# Patient Record
Sex: Male | Born: 1980 | Race: White | Hispanic: No | Marital: Married | Smoking: Never smoker
Health system: Southern US, Community
[De-identification: ages and names within clinical notes are randomized; demographics above are authoritative.]

## PROBLEM LIST (undated history)

## (undated) DIAGNOSIS — Z8739 Personal history of other diseases of the musculoskeletal system and connective tissue: Secondary | ICD-10-CM

## (undated) DIAGNOSIS — J9383 Other pneumothorax: Secondary | ICD-10-CM

## (undated) HISTORY — PX: CHEST TUBE INSERTION: SHX231

## (undated) HISTORY — PX: APPENDECTOMY: SHX54

---

## 2010-09-29 ENCOUNTER — Emergency Department (HOSPITAL_BASED_OUTPATIENT_CLINIC_OR_DEPARTMENT_OTHER)
Admission: EM | Admit: 2010-09-29 | Discharge: 2010-09-29 | Disposition: A | Discharge status: Home / Self Care | Payer: No Typology Code available for payment source | Attending: Emergency Medicine | Admitting: Emergency Medicine

## 2010-09-29 ENCOUNTER — Emergency Department (INDEPENDENT_AMBULATORY_CARE_PROVIDER_SITE_OTHER): Payer: No Typology Code available for payment source

## 2010-09-29 DIAGNOSIS — S81009A Unspecified open wound, unspecified knee, initial encounter: Secondary | ICD-10-CM

## 2010-09-29 DIAGNOSIS — W298XXA Contact with other powered powered hand tools and household machinery, initial encounter: Secondary | ICD-10-CM

## 2010-09-29 DIAGNOSIS — W01119A Fall on same level from slipping, tripping and stumbling with subsequent striking against unspecified sharp object, initial encounter: Secondary | ICD-10-CM | POA: Insufficient documentation

## 2010-09-29 DIAGNOSIS — S91009A Unspecified open wound, unspecified ankle, initial encounter: Secondary | ICD-10-CM | POA: Insufficient documentation

## 2016-11-10 ENCOUNTER — Emergency Department (HOSPITAL_BASED_OUTPATIENT_CLINIC_OR_DEPARTMENT_OTHER)
Admission: EM | Admit: 2016-11-10 | Discharge: 2016-11-10 | Disposition: A | Discharge status: Home / Self Care | Payer: 59 | Attending: Emergency Medicine | Admitting: Emergency Medicine

## 2016-11-10 ENCOUNTER — Emergency Department (HOSPITAL_BASED_OUTPATIENT_CLINIC_OR_DEPARTMENT_OTHER): Payer: 59

## 2016-11-10 ENCOUNTER — Encounter (HOSPITAL_BASED_OUTPATIENT_CLINIC_OR_DEPARTMENT_OTHER): Payer: Self-pay | Admitting: Emergency Medicine

## 2016-11-10 DIAGNOSIS — R071 Chest pain on breathing: Secondary | ICD-10-CM | POA: Diagnosis not present

## 2016-11-10 DIAGNOSIS — S43101A Unspecified dislocation of right acromioclavicular joint, initial encounter: Secondary | ICD-10-CM | POA: Diagnosis not present

## 2016-11-10 DIAGNOSIS — Y929 Unspecified place or not applicable: Secondary | ICD-10-CM | POA: Insufficient documentation

## 2016-11-10 DIAGNOSIS — Y999 Unspecified external cause status: Secondary | ICD-10-CM | POA: Insufficient documentation

## 2016-11-10 DIAGNOSIS — S4991XA Unspecified injury of right shoulder and upper arm, initial encounter: Secondary | ICD-10-CM | POA: Diagnosis present

## 2016-11-10 DIAGNOSIS — S43004A Unspecified dislocation of right shoulder joint, initial encounter: Secondary | ICD-10-CM

## 2016-11-10 DIAGNOSIS — M546 Pain in thoracic spine: Secondary | ICD-10-CM | POA: Diagnosis not present

## 2016-11-10 DIAGNOSIS — Y9355 Activity, bike riding: Secondary | ICD-10-CM | POA: Insufficient documentation

## 2016-11-10 MED ORDER — OXYCODONE-ACETAMINOPHEN 5-325 MG PO TABS
2.0000 | ORAL_TABLET | Freq: Once | ORAL | Status: AC
  Administered 2016-11-10: 2 via ORAL
  Filled 2016-11-10: qty 2

## 2016-11-10 MED ORDER — OXYCODONE-ACETAMINOPHEN 5-325 MG PO TABS: 1.0000 | ORAL_TABLET | ORAL | 0 refills | Status: DC | PRN

## 2016-11-10 MED ORDER — ONDANSETRON 8 MG PO TBDP
8.0000 mg | ORAL_TABLET | Freq: Once | ORAL | Status: AC
  Administered 2016-11-10: 8 mg via ORAL
  Filled 2016-11-10: qty 1

## 2016-11-10 NOTE — ED Notes (Signed)
PT returned from xray
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
## 2016-11-10 NOTE — Discharge Instructions (Signed)
Take the prescribed medication as directed.  Do not drive while taking this.  Wear shoulder sling for support. Follow-up with orthopedics-- call tomorrow to make appt. Return to the ED for new or worsening symptoms.
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
## 2016-11-10 NOTE — ED Notes (Signed)
Alert, NAD, calm, interactive, resps e/u, speaking in clear complete sentences, no dyspnea noted, skin W&D, VSS, pain improved, pinpoints to R shoulder, trapezius, and posterior R scapula /ribs, (denies: sob, nausea, dizziness, weakness, numbness or tingling, or visual changes). Family at [facility].

## 2018-04-17 ENCOUNTER — Inpatient Hospital Stay (HOSPITAL_BASED_OUTPATIENT_CLINIC_OR_DEPARTMENT_OTHER)
Admission: EM | Admit: 2018-04-17 | Discharge: 2018-04-20 | DRG: 201 | Disposition: A | Discharge status: Home / Self Care | Payer: Self-pay | Source: Ambulatory Visit | Attending: Surgery | Admitting: Surgery

## 2018-04-17 ENCOUNTER — Encounter (HOSPITAL_BASED_OUTPATIENT_CLINIC_OR_DEPARTMENT_OTHER): Payer: Self-pay | Admitting: *Deleted

## 2018-04-17 ENCOUNTER — Observation Stay (HOSPITAL_BASED_OUTPATIENT_CLINIC_OR_DEPARTMENT_OTHER): Payer: Self-pay

## 2018-04-17 ENCOUNTER — Other Ambulatory Visit: Payer: Self-pay

## 2018-04-17 ENCOUNTER — Emergency Department (HOSPITAL_BASED_OUTPATIENT_CLINIC_OR_DEPARTMENT_OTHER): Payer: Self-pay

## 2018-04-17 DIAGNOSIS — J939 Pneumothorax, unspecified: Secondary | ICD-10-CM

## 2018-04-17 DIAGNOSIS — Z4682 Encounter for fitting and adjustment of non-vascular catheter: Secondary | ICD-10-CM

## 2018-04-17 DIAGNOSIS — R001 Bradycardia, unspecified: Secondary | ICD-10-CM | POA: Diagnosis present

## 2018-04-17 DIAGNOSIS — J9382 Other air leak: Secondary | ICD-10-CM | POA: Diagnosis not present

## 2018-04-17 DIAGNOSIS — Z8249 Family history of ischemic heart disease and other diseases of the circulatory system: Secondary | ICD-10-CM

## 2018-04-17 DIAGNOSIS — J9311 Primary spontaneous pneumothorax: Principal | ICD-10-CM | POA: Diagnosis present

## 2018-04-17 DIAGNOSIS — I454 Nonspecific intraventricular block: Secondary | ICD-10-CM | POA: Diagnosis present

## 2018-04-17 DIAGNOSIS — Z9689 Presence of other specified functional implants: Secondary | ICD-10-CM

## 2018-04-17 DIAGNOSIS — Z880 Allergy status to penicillin: Secondary | ICD-10-CM

## 2018-04-17 DIAGNOSIS — M109 Gout, unspecified: Secondary | ICD-10-CM | POA: Diagnosis present

## 2018-04-17 DIAGNOSIS — J9383 Other pneumothorax: Secondary | ICD-10-CM | POA: Diagnosis present

## 2018-04-17 HISTORY — DX: Personal history of other diseases of the musculoskeletal system and connective tissue: Z87.39

## 2018-04-17 HISTORY — DX: Other pneumothorax: J93.83

## 2018-04-17 LAB — BASIC METABOLIC PANEL
ANION GAP: 11 (ref 5–15)
BUN: 37 mg/dL — ABNORMAL HIGH (ref 6–20)
CO2: 26 mmol/L (ref 22–32)
Calcium: 9.1 mg/dL (ref 8.9–10.3)
Chloride: 104 mmol/L (ref 98–111)
Creatinine, Ser: 1 mg/dL (ref 0.61–1.24)
Glucose, Bld: 125 mg/dL — ABNORMAL HIGH (ref 70–99)
POTASSIUM: 3.7 mmol/L (ref 3.5–5.1)
Sodium: 141 mmol/L (ref 135–145)

## 2018-04-17 LAB — CBC
HEMATOCRIT: 43.9 % (ref 39.0–52.0)
HEMOGLOBIN: 15.1 g/dL (ref 13.0–17.0)
MCH: 30.3 pg (ref 26.0–34.0)
MCHC: 34.4 g/dL (ref 30.0–36.0)
MCV: 88 fL (ref 78.0–100.0)
Platelets: 326 10*3/uL (ref 150–400)
RBC: 4.99 MIL/uL (ref 4.22–5.81)
RDW: 12.4 % (ref 11.5–15.5)
WBC: 9.2 10*3/uL (ref 4.0–10.5)

## 2018-04-17 LAB — TROPONIN I: Troponin I: <0.03 ng/mL (ref <0.03)

## 2018-04-17 MED ORDER — CEFAZOLIN SODIUM-DEXTROSE 1-4 GM/50ML-% IV SOLN
1.0000 g | Freq: Once | INTRAVENOUS | Status: AC
  Administered 2018-04-17: 1 g via INTRAVENOUS
  Filled 2018-04-17: qty 50

## 2018-04-17 MED ORDER — MIDAZOLAM HCL 2 MG/2ML IJ SOLN
INTRAMUSCULAR | Status: AC
  Filled 2018-04-17: qty 2

## 2018-04-17 MED ORDER — LIDOCAINE-EPINEPHRINE 1 %-1:100000 IJ SOLN
20.0000 mL | Freq: Once | INTRAMUSCULAR | Status: AC
  Administered 2018-04-17: 20 mL
  Filled 2018-04-17: qty 1

## 2018-04-17 MED ORDER — MORPHINE SULFATE (PF) 4 MG/ML IV SOLN: 4.0000 mg | INTRAVENOUS | Status: DC | PRN

## 2018-04-17 MED ORDER — MIDAZOLAM HCL 2 MG/2ML IJ SOLN
2.0000 mg | Freq: Once | INTRAMUSCULAR | Status: AC
  Administered 2018-04-17: 2 mg via INTRAVENOUS
  Filled 2018-04-17: qty 2

## 2018-04-17 MED ORDER — MORPHINE SULFATE (PF) 4 MG/ML IV SOLN
INTRAVENOUS | Status: AC
  Administered 2018-04-17: 4 mg
  Filled 2018-04-17: qty 1

## 2018-04-17 MED ORDER — OXYCODONE HCL 5 MG PO TABS
5.0000 mg | ORAL_TABLET | ORAL | Status: DC | PRN
  Administered 2018-04-17 – 2018-04-19 (×3): 10 mg via ORAL
  Filled 2018-04-17 (×3): qty 2

## 2018-04-17 NOTE — ED Notes (Addendum)
EDP, PA, RTT, RN at bedside for chest tube insertion. Time out performed. Consent signed.

## 2018-04-17 NOTE — ED Notes (Signed)
ED Provider at bedside. 

## 2018-04-17 NOTE — ED Provider Notes (Signed)
[facility] EMERGENCY DEPARTMENT Provider Note   CSN: [id number] Arrival date & time: 04/17/18  1652     History   Chief Complaint Chief Complaint  Patient presents with  . Chest Pain    HPI Ricky Hall is a 37 y.o. male.  37 year old male with no significant past medical history who presents with chest pain.  This morning the patient was walking around at work and began having left-sided chest pain that he describes as pressure in his left chest involving his shoulder and sometimes radiating to his back.  The pain has been associated with shortness of breath, no nausea, vomiting, or diaphoresis.  The pain has been intermittent, worse with activity and absent at rest.  Currently he denies any symptoms while he is sitting here.  He denies any leg swelling/pain, recent travel, or history of blood clots.  No tobacco, alcohol, or drug use.  History notable for father with heart disease starting in his mid 52s.  The history is provided by the patient.  Chest Pain      History reviewed. No pertinent past medical history.  Patient Active Problem List   Diagnosis Date Noted  . Spontaneous pneumothorax 04/17/2018    Past Surgical History:  Procedure Laterality Date  . APPENDECTOMY          Home Medications    Prior to Admission medications   Medication Sig Start Date End Date Taking? Authorizing Provider  oxyCODONE-acetaminophen (PERCOCET) 5-325 MG tablet Take 1 tablet by mouth every 4 (four) hours as needed. 11/10/16   Garlon Hatchet, PA-C    Family History No family history on file.  Social History Social History   Tobacco Use  . Smoking status: Never Smoker  . Smokeless tobacco: Never Used  Substance Use Topics  . Alcohol use: No  . Drug use: Not on file     Allergies   Penicillins   Review of Systems Review of Systems  Cardiovascular: Positive for chest pain.   All other systems reviewed and are negative except that which was mentioned in  HPI   Physical Exam Updated Vital Signs BP 130/81   Pulse 79   Temp 98.4 F (36.9 C) (Oral)   Resp 19   Ht 5\' 9"  (1.753 m)   Wt 84.4 kg   SpO2 99%   BMI 27.47 kg/m   Physical Exam  Constitutional: He is oriented to person, place, and time. He appears well-developed and well-nourished. No distress.  HENT:  Head: Normocephalic and atraumatic.  Moist mucous membranes  Eyes: Conjunctivae are normal.  Neck: Neck supple.  Cardiovascular: Normal rate, regular rhythm and normal heart sounds.  No murmur heard. Pulmonary/Chest: Effort normal.  Diminished breath sounds L lung  Abdominal: Soft. Bowel sounds are normal. He exhibits no distension. There is no tenderness.  Musculoskeletal: He exhibits no edema.  Neurological: He is alert and oriented to person, place, and time.  Fluent speech  Skin: Skin is warm and dry.  Psychiatric: He has a normal mood and affect. Judgment normal.  Nursing note and vitals reviewed.    ED Treatments / Results  Labs (all labs ordered are listed, but only abnormal results are displayed) Labs Reviewed  BASIC METABOLIC PANEL - Abnormal; Notable for the following components:      Result Value   Glucose, Bld 125 (*)    BUN 37 (*)    All other components within normal limits  CBC  TROPONIN I    EKG EKG Interpretation  Date/Time:  Friday April 17 2018 17:11:21 EDT Ventricular Rate:  75 PR Interval:    QRS Duration: 104 QT Interval:  381 QTC Calculation: 426 R Axis:   50 Text Interpretation:  Sinus rhythm RSR' in V1 or V2, right VCD or RVH No previous ECGs available Confirmed by Frederick Peers [phone]) on 04/17/2018 5:15:03 PM   Radiology Dg Chest 2 View  Result Date: 04/17/2018 CLINICAL DATA:  Chest and left shoulder pain this morning. Nonsmoker. No acute injury. EXAM: CHEST - 2 VIEW COMPARISON:  11/10/2016 radiographs. FINDINGS: There is a new at least moderate size left pneumothorax, approximately 50%. There is possible minimal  mediastinal shift to the right. The heart size is normal. The lungs are clear. There is no significant pleural effusion. No fractures are identified. Telemetry leads overlie the chest. IMPRESSION: New at least moderate-sized left pneumothorax with possible early tension component. No evidence of underlying rib fracture. Critical Value/emergent results were called by telephone at the time of interpretation on 04/17/2018 at 5:31 pm to Dr. Frederick Peers , who verbally acknowledged these results. Electronically Signed   By: Carey Bullocks M.D.   On: 04/17/2018 17:32   Dg Chest Portable 1 View  Result Date: 04/17/2018 CLINICAL DATA:  Status post chest tube placement EXAM: PORTABLE CHEST 1 VIEW COMPARISON:  04/17/2018 FINDINGS: Interim placement of left chest tube with pigtail over the left lower chest. Decreased left pneumothorax with small apical residual demonstrating just under 1 cm pleural-parenchymal separation at the left apex. Irregular airspace disease in the left lower chest, could reflect atelectasis or small foci of hemorrhage. Normal heart size. IMPRESSION: Interim placement of left lower chest drainage tube with decreased size of left pneumothorax. Small residual left apical pneumothorax. Electronically Signed   By: Jasmine Pang M.D.   On: 04/17/2018 19:48    Procedures .Critical Care Performed by: Laurence Spates, MD Authorized by: Laurence Spates, MD   Critical care provider statement:    Critical care time (minutes):  40   Critical care time was exclusive of:  Separately billable procedures and treating other patients   Critical care was necessary to treat or prevent imminent or life-threatening deterioration of the following conditions: pneumothorax.   Critical care was time spent personally by me on the following activities:  Development of treatment plan with patient or surrogate, discussions with consultants, evaluation of patient's response to treatment, examination of  patient, obtaining history from patient or surrogate, ordering and performing treatments and interventions, ordering and review of laboratory studies, ordering and review of radiographic studies and re-evaluation of patient's condition CHEST TUBE INSERTION Date/Time: 04/17/2018 7:58 PM Performed by: Laurence Spates, MD Authorized by: Laurence Spates, MD   Consent:    Consent obtained:  Written   Consent given by:  Patient   Risks discussed:  Bleeding, incomplete drainage, damage to surrounding structures, infection, pain and nerve damage   Alternatives discussed:  Delayed treatment Pre-procedure details:    Skin preparation:  ChloraPrep   Preparation: Patient was prepped and draped in the usual sterile fashion   Sedation:    Sedation type:  Anxiolysis Anesthesia (see MAR for exact dosages):    Anesthesia method:  Local infiltration   Local anesthetic:  Lidocaine 1% WITH epi Procedure details:    Placement location:  L lateral   Tube size (Fr):  Minicatheter   Tension pneumothorax: no     Tube connected to:  Suction and water seal   Drainage characteristics:  Air only  Suture material:  2-0 silk   Dressing: biopatch, tegaderm. Post-procedure details:    Post-insertion x-ray findings: tube in good position     Patient tolerance of procedure:  Tolerated well, no immediate complications Comments:     Used pigtail catheter to place tube using Seldinger technique   (including critical care time)  Medications Ordered in ED Medications  ceFAZolin (ANCEF) IVPB 1 g/50 mL premix (has no administration in time range)  morphine 4 MG/ML injection 4 mg (has no administration in time range)  lidocaine-EPINEPHrine (XYLOCAINE W/EPI) 1 %-1:100000 (with pres) injection 20 mL (20 mLs Infiltration Given 04/17/18 1927)  midazolam (VERSED) injection 2 mg (2 mg Intravenous Given 04/17/18 1856)  morphine 4 MG/ML injection (4 mg  Given 04/17/18 1924)     Initial Impression / Assessment and  Plan / ED Course  I have reviewed the triage vital signs and the nursing notes.  Pertinent labs & imaging results that were available during my care of the patient were reviewed by me and considered in my medical decision making (see chart for details).     He was comfortable and well-appearing on exam, denied any complaints.  Vital signs reassuring.  O2 saturation 94 to 96% on room air, normal heart rate, normal respiratory rate.  Chest x-ray shows moderate sized left pneumothorax.  He has no evidence of tension physiology on physical exam. Consulted thoracic surgery, discussed w/ Dr. Laneta Simmers who agreed with plan to place pigtail chest tube. He has accepted the patient in transfer to [facility].   See procedure note for details. Gave ancef. Pt tolerated well, repeat CXR shows improved lung expansion, small remaining PTX. VSS. Pt transferred to [facility] for further care.  Final Clinical Impressions(s) / ED Diagnoses   Final diagnoses:  Primary spontaneous pneumothorax    ED Discharge Orders    None       Jynesis Nakamura, Ambrose Finland, MD 04/17/18 [phone]

## 2018-04-17 NOTE — Progress Notes (Signed)
Received report from [facility] for patient coming to [id number]. Victorino December, RN

## 2018-04-17 NOTE — Progress Notes (Signed)
Paged attending, Dr. Laneta Simmers when patient arrived in room  [facility]. Given verbal orders for regular diet, CT placed on -20cm suction, portable chest xray in am and oxycodone for pain q3hrs. Will continue to monitor. Victorino December, RN

## 2018-04-17 NOTE — ED Triage Notes (Signed)
Left sided chest pain today while working. This am while at the gym he was having pain in his shoulder into his neck.

## 2018-04-17 NOTE — Progress Notes (Signed)
Patient arrived from ED at [facility] to [facility]. CHG bath given, patient connected to cardiac monitor, assessment performed and patient oriented to room. Bed placed in lowest position and call bell within reach. Will continue to monitor. Victorino December, RN

## 2018-04-17 NOTE — ED Notes (Signed)
Chest xray at bedside.

## 2018-04-18 ENCOUNTER — Observation Stay (HOSPITAL_COMMUNITY): Payer: Self-pay

## 2018-04-18 DIAGNOSIS — J9311 Primary spontaneous pneumothorax: Secondary | ICD-10-CM

## 2018-04-18 NOTE — H&P (Addendum)
[address]            [place] [address]          [phone]     Ricky Hall is an 37 y.o. male. 11-15-1980 UEA:[id number]   Chief Complaint: Left sided chest pain, shortness of breath Reason for admission: Spontaneous left pneumothorax  History of Presenting Illness:  This is a 37 year old Caucasian male who was in his usual state of health until yesterday morning when he experienced left sided chest pain and shortness of breath. He also stated the pain sometimes radiated to his back. He has no history of tobacco abuse, denies recent travel or trauma. He presented to [facility] initially. Chest xray showed a 50% left pneumothorax. Dr. Rex Kras placed a left pigtail catheter. Follow up chest x ray showed a small, residual left apical pneumothorax. Patient was accepted in transfer to [facility] for further evaluation and treatment. Currently, patient's vital signs are stable and he  is no acute distress.  Past Medical History: Past Medical History:  Diagnosis Date  . History of gout   . Spontaneous pneumothorax 04/17/2018   "partial; left lung" (04/17/2018)    Past Surgical History: Past Surgical History:  Procedure Laterality Date  . APPENDECTOMY    . CHEST TUBE INSERTION Left    "partial lung collapse"    Family History: Father-heart disease in his 53's.   Social History:   reports that he has never smoked. He has never used smokeless tobacco. He reports that he drinks about 3.0 standard drinks of alcohol per week. He reports that he does not use drugs.  Allergies:  Allergies  Allergen Reactions  . Penicillins     Medications Prior to Admission  Medication Sig Dispense Refill  . oxyCODONE-acetaminophen (PERCOCET) 5-325 MG tablet Take 1 tablet by mouth every 4 (four) hours as needed. 20 tablet 0    Review of Systems:   General Review of Systems: [Y] = yes [N ]=no  Constitional:  nausea Aqua.Slicker ]; night sweats [ N];  fever Aqua.Slicker ]; or chills [ N];  Eye : diplopia Aqua.Slicker ]; vision changes Aqua.Slicker ]; Amaurosis fugax[N ];  Resp: cough Aqua.Slicker ]; wheezing[N ]; hemoptysis[N ];  GI: , vomiting[ N]; dysphagia[N ]; melenaN[ ] ; hematochezia Aqua.Slicker ];  YN:WGNFAOZHY[Q ]; dysuria [ ] ; nocturia[ ] ; history of obstruction [ ] ; Heme/Lymph:  anemia[N ];  Neuro: TIA[N ];stroke[N ]; vertigo[N ]; Endocrine: diabetes[N ]; thyroid dysfunction[N ];    BP 117/68 (BP Location: Left Arm)   Pulse 71   Temp 98.6 F (37 C) (Oral)   Resp 18   Ht 5' 9"  (1.753 m)   Wt 83.8 kg   SpO2 96%   BMI 27.28 kg/m   Physical Exam: General appearance: alert, cooperative and no distress Neurologic: intact Heart: RRR Lungs: clear to auscultation bilaterally Abdomen: Soft, non tender, bowel sounds present Extremities: No LE edema;Palpable DP/PT bilaterally Pigtail catheter: to suction, no air leak  Diagnostic Studies and Laboratory Results: Results for orders placed or performed during the hospital encounter of 04/17/18 (from the past 48 hour(s))  Basic metabolic panel     Status: Abnormal   Collection Time: 04/17/18  5:03 PM  Result Value Ref Range   Sodium 141 135 - 145 mmol/L   Potassium 3.7 3.5 - 5.1 mmol/L   Chloride 104 98 - 111 mmol/L  CO2 26 22 - 32 mmol/L   Glucose, Bld 125 (H) 70 - 99 mg/dL   BUN 37 (H) 6 - 20 mg/dL   Creatinine, Ser 1.00 0.61 - 1.24 mg/dL   Calcium 9.1 8.9 - 10.3 mg/dL   GFR calc non Af Amer >60 >60 mL/min   GFR calc Af Amer >60 >60 mL/min    Comment: (NOTE) The eGFR has been calculated using the CKD EPI equation. This calculation has not been validated in all clinical situations. eGFR's persistently <60 mL/min signify possible Chronic Kidney Disease.    Anion gap 11 5 - 15    Comment: Performed at [facility], [address] [place], [place] [address]  CBC     Status: None   Collection Time: 04/17/18  5:03 PM  Result Value Ref Range   WBC 9.2 4.0 - 10.5 K/uL   RBC 4.99 4.22 - 5.81 MIL/uL    Hemoglobin 15.1 13.0 - 17.0 g/dL   HCT 43.9 39.0 - 52.0 %   MCV 88.0 78.0 - 100.0 fL   MCH 30.3 26.0 - 34.0 pg   MCHC 34.4 30.0 - 36.0 g/dL   RDW 12.4 11.5 - 15.5 %   Platelets 326 150 - 400 K/uL    Comment: Performed at [facility], [address] [place], [place] [address]  Troponin I     Status: None   Collection Time: 04/17/18  5:03 PM  Result Value Ref Range   Troponin I <0.03 <0.03 ng/mL    Comment: Performed at [facility], [address], [place], [place] [address]   Dg Chest 2 View  Result Date: 04/17/2018 CLINICAL DATA:  Chest and left shoulder pain this morning. Nonsmoker. No acute injury. EXAM: CHEST - 2 VIEW COMPARISON:  11/10/2016 radiographs. FINDINGS: There is a new at least moderate size left pneumothorax, approximately 50%. There is possible minimal mediastinal shift to the right. The heart size is normal. The lungs are clear. There is no significant pleural effusion. No fractures are identified. Telemetry leads overlie the chest. IMPRESSION: New at least moderate-sized left pneumothorax with possible early tension component. No evidence of underlying rib fracture. Critical Value/emergent results were called by telephone at the time of interpretation on 04/17/2018 at 5:31 pm to Dr. Theotis Burrow , who verbally acknowledged these results. Electronically Signed   By: Richardean Sale M.D.   On: 04/17/2018 17:32   Dg Chest Port 1 View  Result Date: 04/18/2018 CLINICAL DATA:  Chest tube in place. EXAM: PORTABLE CHEST 1 VIEW COMPARISON:  04/17/2018 FINDINGS: Left chest tube in place. No significant residual pneumothorax. Linear scarring in the left lung base. Right lung is clear. Heart size and pulmonary vascularity are normal. Mediastinal contours appear intact. IMPRESSION: Left chest tube in place. No evidence of active pulmonary disease. No significant residual pneumothorax. Electronically Signed   By: Lucienne Capers M.D.   On: 04/18/2018 06:59   Dg Chest  Portable 1 View  Result Date: 04/17/2018 CLINICAL DATA:  Status post chest tube placement EXAM: PORTABLE CHEST 1 VIEW COMPARISON:  04/17/2018 FINDINGS: Interim placement of left chest tube with pigtail over the left lower chest. Decreased left pneumothorax with small apical residual demonstrating just under 1 cm pleural-parenchymal separation at the left apex. Irregular airspace disease in the left lower chest, could reflect atelectasis or small foci of hemorrhage. Normal heart size. IMPRESSION: Interim placement of left lower chest drainage tube with decreased size of left  pneumothorax. Small residual left apical pneumothorax. Electronically Signed   By: Donavan Foil M.D.   On: 04/17/2018 19:48     Assessment/Plan Left spontaneous pneumothorax-etiology to be determined. Hope to place pigtail catheter to water seal soon. 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 Patient instructed he will need to refrain from these activities until evaluated after discharge in the office.  Nani Skillern, PA-C 04/18/2018, 8:01 AM  Medical record and CXR reviewed, patient examined. He has a first episode of spontaneous left ptx. Pigtail placed by ER physician at [facility] since there was early tension ptx on CXR and patient transferred here for further care. CXR this am shows no significant ptx. There is a small intermittent air leak from the tube. Will keep to suction. I discussed the pathology and management of spontaneous ptx with patient and wife. Will repeat CXR in am.

## 2018-04-18 NOTE — Plan of Care (Signed)
Care plans reviewed and patient is progressing.  

## 2018-04-18 NOTE — Progress Notes (Addendum)
      [address]       [name] [address]             [phone]           Subjective: Patient without specific complaints this am except pain at chest tube site with movement.  Objective: Vital signs in last 24 hours: Temp:  [98.4 F (36.9 C)-98.6 F (37 C)] 98.6 F (37 C) (09/07 0345) Pulse Rate:  [71-93] 71 (09/07 0345) Cardiac Rhythm: Sinus bradycardia;Bundle branch block (09/06 2115) Resp:  [16-22] 18 (09/07 0345) BP: (111-134)/(68-91) 117/68 (09/07 0345) SpO2:  [96 %-100 %] 96 % (09/07 0345) Weight:  [83.8 kg-84.4 kg] 83.8 kg (09/06 2048)      Intake/Output from previous day: 09/06 0701 - 09/07 0700 In: 50 [IV Piggyback:50] Out: -    Physical Exam:  Cardiovascular: RRR Pulmonary: Clear to auscultation bilaterally Abdomen: Soft, non tender, bowel sounds present. Extremities: No lower extremity edema. Chest Tube: to suction, no air leak  Lab Results: CBC: Recent Labs    04/17/18 1703  WBC 9.2  HGB 15.1  HCT 43.9  PLT 326   BMET:  Recent Labs    04/17/18 1703  NA 141  K 3.7  CL 104  CO2 26  GLUCOSE 125*  BUN 37*  CREATININE 1.00  CALCIUM 9.1    PT/INR: No results for input(s): LABPROT, INR in the last 72 hours. ABG:  INR: Will add last result for INR, ABG once components are confirmed Will add last 4 CBG results once components are confirmed  Assessment/Plan:  1. CV - SR . 2.  Pulmonary - S/p left pigtail catheter for spontaneous pneumothorax. Chest tube is to suction and there is no air leak. CXR this am shows no pneumothorax. Hope to place chest tube to water seal soon. Check CXR in am  Donielle M ZimmermanPA-C 04/18/2018,7:39 AM [phone]  Chart reviewed, patient examined, agree with above. CXR ok but I see a small intermittent air leak this afternoon.

## 2018-04-19 ENCOUNTER — Inpatient Hospital Stay (HOSPITAL_COMMUNITY): Payer: Self-pay

## 2018-04-19 NOTE — Plan of Care (Signed)
Care plan reviewed and patient is progressing.  

## 2018-04-19 NOTE — Discharge Instructions (Signed)

## 2018-04-19 NOTE — Progress Notes (Addendum)
      [address]       [place] [address]             [phone]           Subjective: Patient sleeping and awakened.  Patient without specific complaints this am except pain at chest tube site with movement.  Objective: Vital signs in last 24 hours: Temp:  [97.1 F (36.2 C)-98.2 F (36.8 C)] 98.1 F (36.7 C) (09/08 0438) Pulse Rate:  [51-61] 51 (09/08 0438) Cardiac Rhythm: Normal sinus rhythm;Bundle branch block (09/07 1923) Resp:  [14-21] 21 (09/08 0438) BP: (116-123)/(66-72) 116/66 (09/08 0438) SpO2:  [88 %] 88 % (09/07 1320)     Intake/Output from previous day: 09/07 0701 - 09/08 0700 In: 780 [P.O.:780] Out: 12 [Chest Tube:12]   Physical Exam:  Cardiovascular: RRR Pulmonary: Clear to auscultation bilaterally Extremities: No lower extremity edema. Chest Tube: to suction, + 1 air leak with cough  Lab Results: CBC: Recent Labs    04/17/18 1703  WBC 9.2  HGB 15.1  HCT 43.9  PLT 326   BMET:  Recent Labs    04/17/18 1703  NA 141  K 3.7  CL 104  CO2 26  GLUCOSE 125*  BUN 37*  CREATININE 1.00  CALCIUM 9.1    PT/INR: No results for input(s): LABPROT, INR in the last 72 hours. ABG:  INR: Will add last result for INR, ABG once components are confirmed Will add last 4 CBG results once components are confirmed  Assessment/Plan:  1. CV - SR . 2.  Pulmonary - S/p left pigtail catheter for spontaneous pneumothorax. Pigtail catheter has scant output and is to suction and there is a +1 air leak with cough. Pigtail to remain to suction for now. CXR this am shows small left apical pneumothorax.  Check CXR in am  Ricky Hall [facility] 04/19/2018,7:16 AM [phone]   Chart reviewed, patient examined, agree with above. CXR shows a tiny sliver left apical ptx. I don't see any air leak at this time even with heavy coughs. Will put to water seal and repeat CXR in am.

## 2018-04-19 NOTE — Discharge Summary (Signed)
Physician Discharge Summary       [address]       [name] [address]             [phone]    Patient ID: Ricky Hall MRN: [id number] DOB/AGE: [birth year] 37 y.o.  Admit date: 04/17/2018 Discharge date: 04/20/2018  Admission Diagnoses: Left spontaneous pneumothorax  Discharge Diagnoses:  History of gout    Procedure (s):  Left pigtail catheter placed on 09/06 by Dr. Clarene Duke.  History of Presenting Illness: This is a 37 year old Caucasian male who was in his usual state of health until yesterday morning when he experienced left sided chest pain and shortness of breath. He also stated the pain sometimes radiated to his back. He has no history of tobacco abuse, denies recent travel or trauma. He presented to [facility] initially. Chest xray showed a 50% left pneumothorax. Dr. Clarene Duke placed a left pigtail catheter. Follow up chest x ray showed a small, residual left apical pneumothorax. Patient was accepted in transfer to [facility] for further evaluation and treatment. Patient's vital signs are stable and he  is no acute distress. He was admitted for further evaluation and management.  Brief Hospital Course:   The patient remained afebrile and hemodynamically stable.  Chest tube had a small air leak. It was placed to water seal on Daily chest x rays were obtained and remained stable. Chest tube was removed on 04/20/2018. Patient is ambulating on room air. Patient is tolerating a diet and has had a bowel movement. Wounds are clean and dry. Final chest X ray showed stable appearance of minimal left apical pneumothorax. Patient is felt surgically stable for discharge today.   Latest Vital Signs: Blood pressure 120/77, pulse 73, temperature 98 F (36.7 C), temperature source Oral, resp. rate 14, height 5\' 9"  (1.753 m), weight 83.8 kg, SpO2 100 %.  Physical Exam:  General appearance: alert, cooperative and no distress Heart: regular rate and  rhythm Lungs: clear to auscultation bilaterally Abdomen: soft, non-tender; bowel sounds normal; no masses,  no organomegaly Extremities: extremities normal, atraumatic, no cyanosis or edema Wound: clean and dry   Discharge Condition:Stable and discharged to home.  Recent laboratory studies:  Lab Results  Component Value Date   WBC 9.2 04/17/2018   HGB 15.1 04/17/2018   HCT 43.9 04/17/2018   MCV 88.0 04/17/2018   PLT 326 04/17/2018   Lab Results  Component Value Date   NA 141 04/17/2018   K 3.7 04/17/2018   CL 104 04/17/2018   CO2 26 04/17/2018   CREATININE 1.00 04/17/2018   GLUCOSE 125 (H) 04/17/2018      Diagnostic Studies: Dg Chest 2 View  Result Date: 04/20/2018 CLINICAL DATA:  Chest tube removal. EXAM: CHEST - 2 VIEW COMPARISON:  Radiographs of same day. FINDINGS: The heart size and mediastinal contours are within normal limits. Left-sided chest tube has been removed. Minimal left apical pneumothorax is noted which is stable compared to prior exam. No consolidative process is noted. The visualized skeletal structures are unremarkable. IMPRESSION: Status post left-sided chest tube removal. Stable minimal left apical pneumothorax. Electronically Signed   By: Lupita Raider, M.D.   On: 04/20/2018 13:25   Dg Chest 2 View  Result Date: 04/20/2018 CLINICAL DATA:  Spontaneous pneumothorax, left chest tube. EXAM: CHEST - 2 VIEW COMPARISON:  04/19/2018. FINDINGS: Trachea is midline. Heart size normal. Near complete re-expansion of the left lung with trace left apical pleural air and  small bore left chest tube in place. Lungs are clear. No pleural fluid. Subcutaneous emphysema along the left lateral chest wall. IMPRESSION: Trace residual left apical pneumothorax, decreased from 04/19/2018, with left chest tube in place. Electronically Signed   By: Leanna Battles M.D.   On: 04/20/2018 08:32   Dg Chest 2 View  Result Date: 04/17/2018 CLINICAL DATA:  Chest and left shoulder pain this  morning. Nonsmoker. No acute injury. EXAM: CHEST - 2 VIEW COMPARISON:  11/10/2016 radiographs. FINDINGS: There is a new at least moderate size left pneumothorax, approximately 50%. There is possible minimal mediastinal shift to the right. The heart size is normal. The lungs are clear. There is no significant pleural effusion. No fractures are identified. Telemetry leads overlie the chest. IMPRESSION: New at least moderate-sized left pneumothorax with possible early tension component. No evidence of underlying rib fracture. Critical Value/emergent results were called by telephone at the time of interpretation on 04/17/2018 at 5:31 pm to Dr. Frederick Peers , who verbally acknowledged these results. Electronically Signed   By: Carey Bullocks M.D.   On: 04/17/2018 17:32   Dg Chest Port 1 View  Result Date: 04/19/2018 CLINICAL DATA:  Chest tube EXAM: PORTABLE CHEST 1 VIEW COMPARISON:  Chest radiograph 04/18/2018 FINDINGS: There is a left chest tube with tip near the lateral aspect of the left hemithorax. There is a small left apical pneumothorax, increased from the prior study. The right lung is clear. Normal cardiomediastinal contours. IMPRESSION: Small left apical pneumothorax. Electronically Signed   By: Deatra Robinson M.D.   On: 04/19/2018 06:56   Dg Chest Port 1 View  Result Date: 04/18/2018 CLINICAL DATA:  Chest tube in place. EXAM: PORTABLE CHEST 1 VIEW COMPARISON:  04/17/2018 FINDINGS: Left chest tube in place. No significant residual pneumothorax. Linear scarring in the left lung base. Right lung is clear. Heart size and pulmonary vascularity are normal. Mediastinal contours appear intact. IMPRESSION: Left chest tube in place. No evidence of active pulmonary disease. No significant residual pneumothorax. Electronically Signed   By: Burman Nieves M.D.   On: 04/18/2018 06:59   Dg Chest Portable 1 View  Result Date: 04/17/2018 CLINICAL DATA:  Status post chest tube placement EXAM: PORTABLE CHEST 1 VIEW  COMPARISON:  04/17/2018 FINDINGS: Interim placement of left chest tube with pigtail over the left lower chest. Decreased left pneumothorax with small apical residual demonstrating just under 1 cm pleural-parenchymal separation at the left apex. Irregular airspace disease in the left lower chest, could reflect atelectasis or small foci of hemorrhage. Normal heart size. IMPRESSION: Interim placement of left lower chest drainage tube with decreased size of left pneumothorax. Small residual left apical pneumothorax. Electronically Signed   By: Jasmine Pang M.D.   On: 04/17/2018 19:48    Discharge Medications: Allergies as of 04/20/2018      Reactions   Penicillins       Medication List    TAKE these medications   ciprofloxacin 500 MG tablet Commonly known as:  CIPRO Take 500 mg by mouth 2 (two) times daily. Started on 04-07-18 Duration 7 days   ibuprofen 200 MG tablet Commonly known as:  ADVIL,MOTRIN Take 4 tablets (800 mg total) by mouth every 8 (eight) hours as needed (pain).   oxyCODONE-acetaminophen 5-325 MG tablet Commonly known as:  PERCOCET/ROXICET Take 1 tablet by mouth every 4 (four) hours as needed.       Follow Up Appointments: Follow-up Information    [facility] Follow up  on 04/28/2018.   Specialty:  Cardiothoracic Surgery Why:  Appointment is at 2:30, please get CXR at 2:00 at [facility] located on first floor of our office building Contact information: [address] [place], [address] [place] [address] [phone]          Signed: Carl Best 04/20/2018, 1:56 PM

## 2018-04-20 ENCOUNTER — Inpatient Hospital Stay (HOSPITAL_COMMUNITY): Payer: Self-pay

## 2018-04-20 MED ORDER — IBUPROFEN 800 MG PO TABS: 800.0000 mg | ORAL_TABLET | Freq: Three times a day (TID) | ORAL | 0 refills | Status: DC | PRN

## 2018-04-20 MED ORDER — IBUPROFEN 200 MG PO TABS: 800.0000 mg | ORAL_TABLET | Freq: Three times a day (TID) | ORAL | Status: DC | PRN

## 2018-04-20 NOTE — Progress Notes (Addendum)
Patient education completed for removal of chest tube, he verbalized understanding, chest tube removed as ordered without difficulty. No sutures found around the the immediate catheter entry site, suture found about an inch away from the catheter entry site tied around the catheter, sutures removed.  Patient resting quietly in bed , no sign of distress noted will monitor.

## 2018-04-20 NOTE — Progress Notes (Signed)
Patient in a stable condition, discharge education reviewed with patient, he verbalized understanding, iv removed , tele dc ccmd notified, patient belongings at bedside, patient awaiting his family for transportation home.

## 2018-04-20 NOTE — Progress Notes (Addendum)
      [address]       [place] [address]             [phone]      Subjective:  No new complaints.  Pain is improved.    Objective: Vital signs in last 24 hours: Temp:  [98 F (36.7 C)-98.6 F (37 C)] 98 F (36.7 C) (09/09 0735) Pulse Rate:  [61-73] 73 (09/09 0458) Cardiac Rhythm: Normal sinus rhythm (09/09 0701) Resp:  [12-15] 14 (09/09 0735) BP: (110-122)/(63-77) 120/77 (09/09 0735)  Intake/Output from previous day: 09/08 0701 - 09/09 0700 In: 240 [P.O.:240] Out: 9 [Urine:1; Chest Tube:8]  General appearance: alert, cooperative and no distress Heart: regular rate and rhythm Lungs: clear to auscultation bilaterally Abdomen: soft, non-tender; bowel sounds normal; no masses,  no organomegaly Extremities: extremities normal, atraumatic, no cyanosis or edema Wound: clean and dry  Lab Results: Recent Labs    04/17/18 1703  WBC 9.2  HGB 15.1  HCT 43.9  PLT 326   BMET:  Recent Labs    04/17/18 1703  NA 141  K 3.7  CL 104  CO2 26  GLUCOSE 125*  BUN 37*  CREATININE 1.00  CALCIUM 9.1    PT/INR: No results for input(s): LABPROT, INR in the last 72 hours. ABG No results found for: PHART, HCO3, TCO2, ACIDBASEDEF, O2SAT CBG (last 3)  No results for input(s): GLUCAP in the last 72 hours.  Assessment/Plan:  1. Chest tube- no air leak present, CXR shows no evidence of pneumothorax, will d/c chest tube today 2. Pulm- no acute issues, off oxygen 3. Dispo- patient stable, will d/c chest tube today, repeat CXR this afternoon and possibly d/c this afternoon   LOS: 2 days    Lowella Dandy 04/20/2018   Chart reviewed, patient examined, agree with above. His CXR looks fine with no ptx. What radiology is calling tiny ptx is a pleural line. There is no air leak or tidaling from the tube with coughing. Will remove tube and repeat CXR and send home if ok. I would like to see him in a week or so with a CXR for followup and to remove the chest tube  suture at that time.

## 2018-04-27 ENCOUNTER — Other Ambulatory Visit: Payer: Self-pay | Admitting: Surgery

## 2018-04-27 DIAGNOSIS — J9383 Other pneumothorax: Secondary | ICD-10-CM

## 2018-04-28 ENCOUNTER — Ambulatory Visit (INDEPENDENT_AMBULATORY_CARE_PROVIDER_SITE_OTHER): Payer: Self-pay | Admitting: Physician Assistant

## 2018-04-28 ENCOUNTER — Other Ambulatory Visit: Payer: Self-pay

## 2018-04-28 ENCOUNTER — Ambulatory Visit
Admission: RE | Admit: 2018-04-28 | Discharge: 2018-04-28 | Disposition: A | Discharge status: Home / Self Care | Payer: Self-pay | Source: Ambulatory Visit | Attending: Surgery | Admitting: Surgery

## 2018-04-28 VITALS — BP 104/74 | HR 73 | Resp 18 | Ht 69.0 in | Wt 190.6 lb

## 2018-04-28 DIAGNOSIS — J9383 Other pneumothorax: Secondary | ICD-10-CM

## 2018-04-28 NOTE — Patient Instructions (Signed)

## 2018-04-28 NOTE — Progress Notes (Signed)
HPI: Patient returns for follow up after chest tube placement for spontaneous pneumothorax on the left.  He is non smoker, but does lift weights regularly.  He presented to the ED on 04/17/2018 with complaints of left sided chest pain and shortness of breath.  CXR showed pneumothorax which required placement of a chest tube.  This was removed on 04/20/2018 with follow up CXR showing minimal apical pneumothorax.  Currently, the patient feels great.  He denies chest pain and shortness of breath.  He has not resumed lifting, but is anxious to do so.  Current Outpatient Medications  Medication Sig Dispense Refill  . ciprofloxacin (CIPRO) 500 MG tablet Take 500 mg by mouth 2 (two) times daily. Started on 04-07-18 Duration 7 days  0  . ibuprofen (ADVIL,MOTRIN) 200 MG tablet Take 4 tablets (800 mg total) by mouth every 8 (eight) hours as needed (pain).    Marland Kitchen. oxyCODONE-acetaminophen (PERCOCET) 5-325 MG tablet Take 1 tablet by mouth every 4 (four) hours as needed. 20 tablet 0   No current facility-administered medications for this visit.     Physical Exam:  BP 104/74 (BP Location: Right Arm, Patient Position: Sitting, Cuff Size: Normal)   Pulse 73   Resp 18   Ht 5\' 9"  (1.753 m)   Wt 190 lb 9.6 oz (86.5 kg)   SpO2 96% Comment: RA  BMI 28.15 kg/m   Gen: no apparent distress Heart: RRR Lungs: CTA bilaterally Chest tube site-well healed  Diagnostic Tests:  CXR: Radiology reads as resolution of left pneumothorax, there does appear to be a minimal left apical pneumothorax that is improved from discharge  A/P:  1. Spontaneous Pneumothorax- likely due to bearing down from lifting weights, CXR shows questionable minimal pneumothorax 2. Patient instructed to refrain from lifting for 1 more week, then he can ease back into his normal workout routine, instructions given should he develop chest pain and shortness of breath again 3. RTC prn  Lowella DandyErin Barrett, PA-C [facility] [phone]  [phone]

## 2020-03-24 IMAGING — DX DG CHEST 2V
2 series · 2 of 2 positions shown · non-contrast
Comparison: 04/19/2018.

CLINICAL DATA: Spontaneous pneumothorax, left chest tube.

EXAM:
CHEST - 2 VIEW

[chest pa]
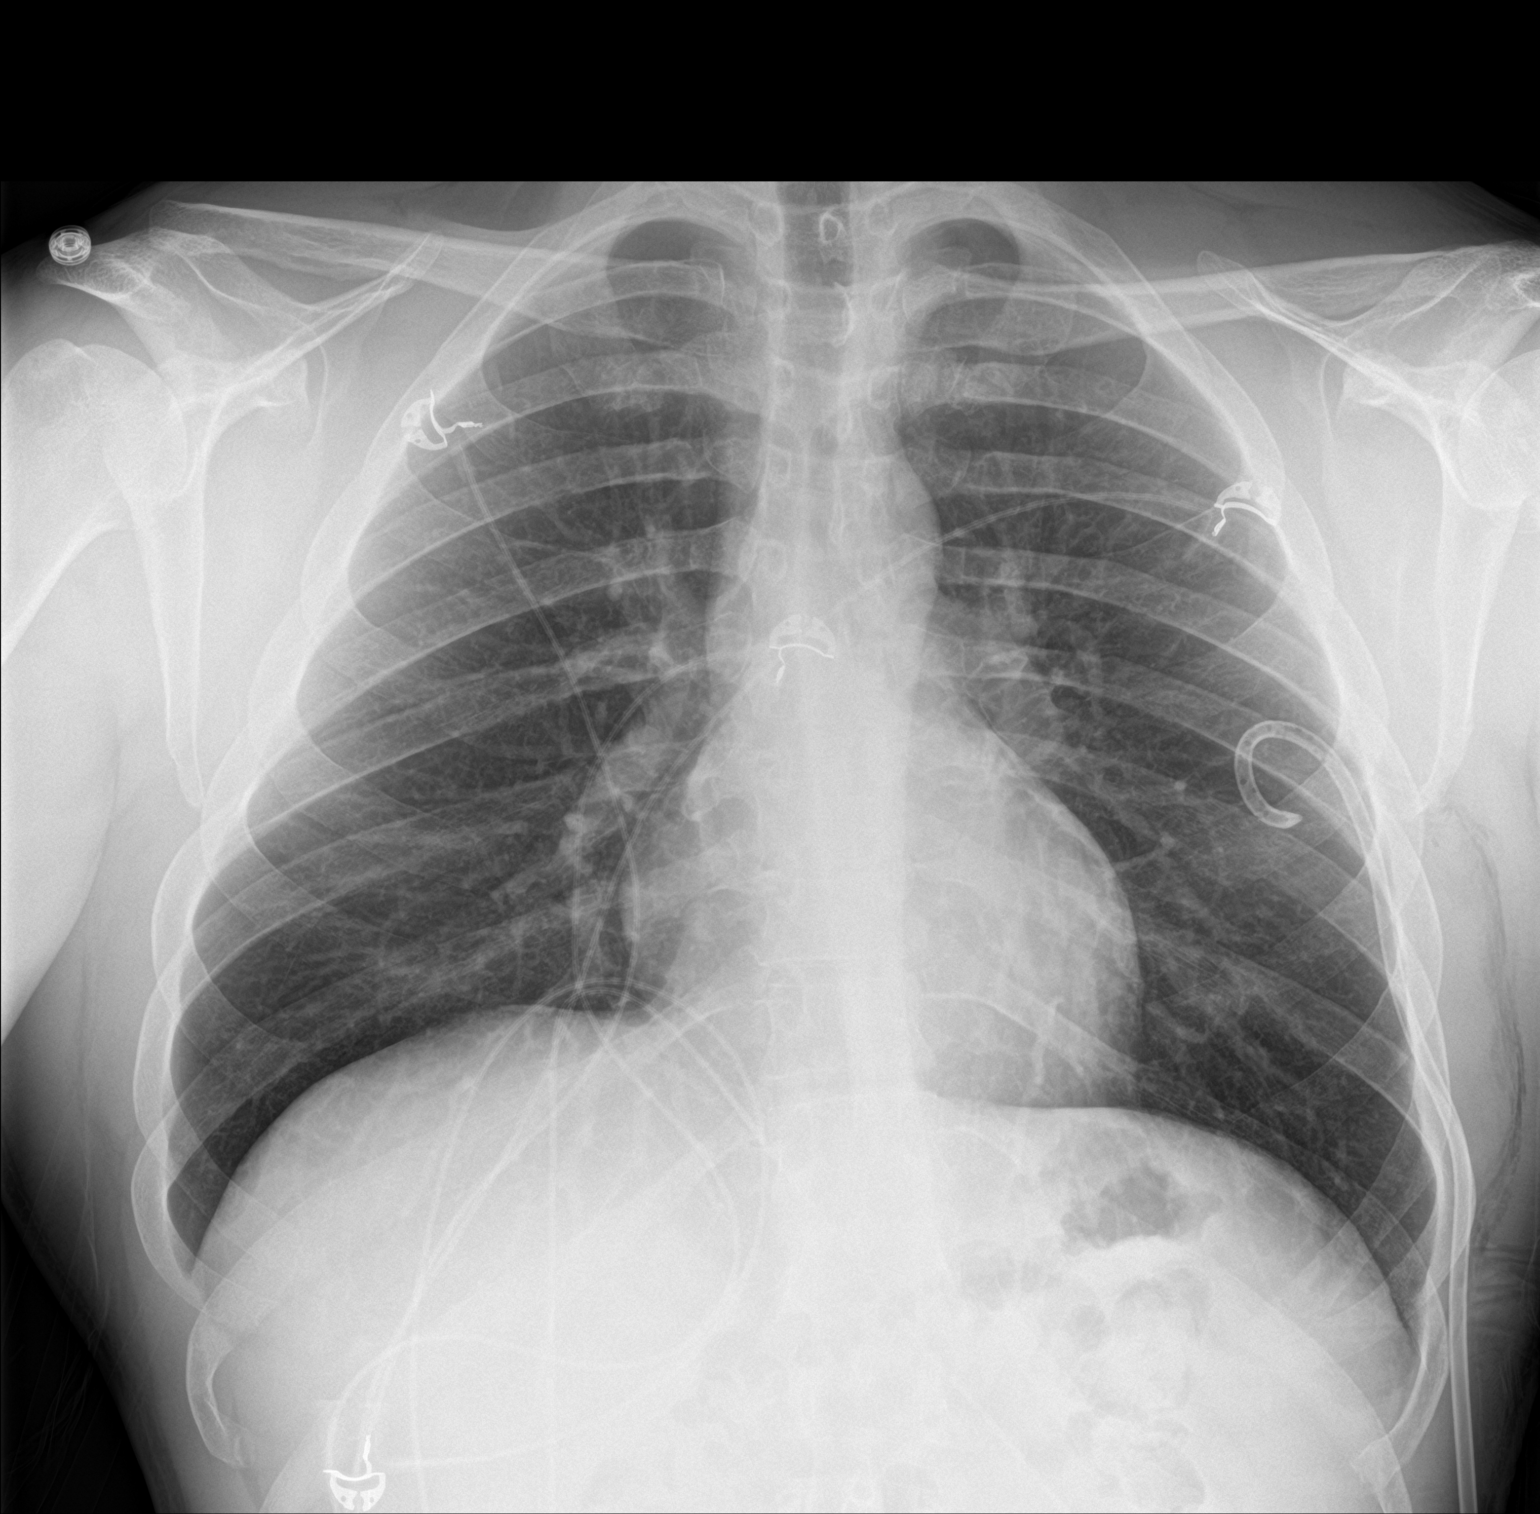

[chest lat]
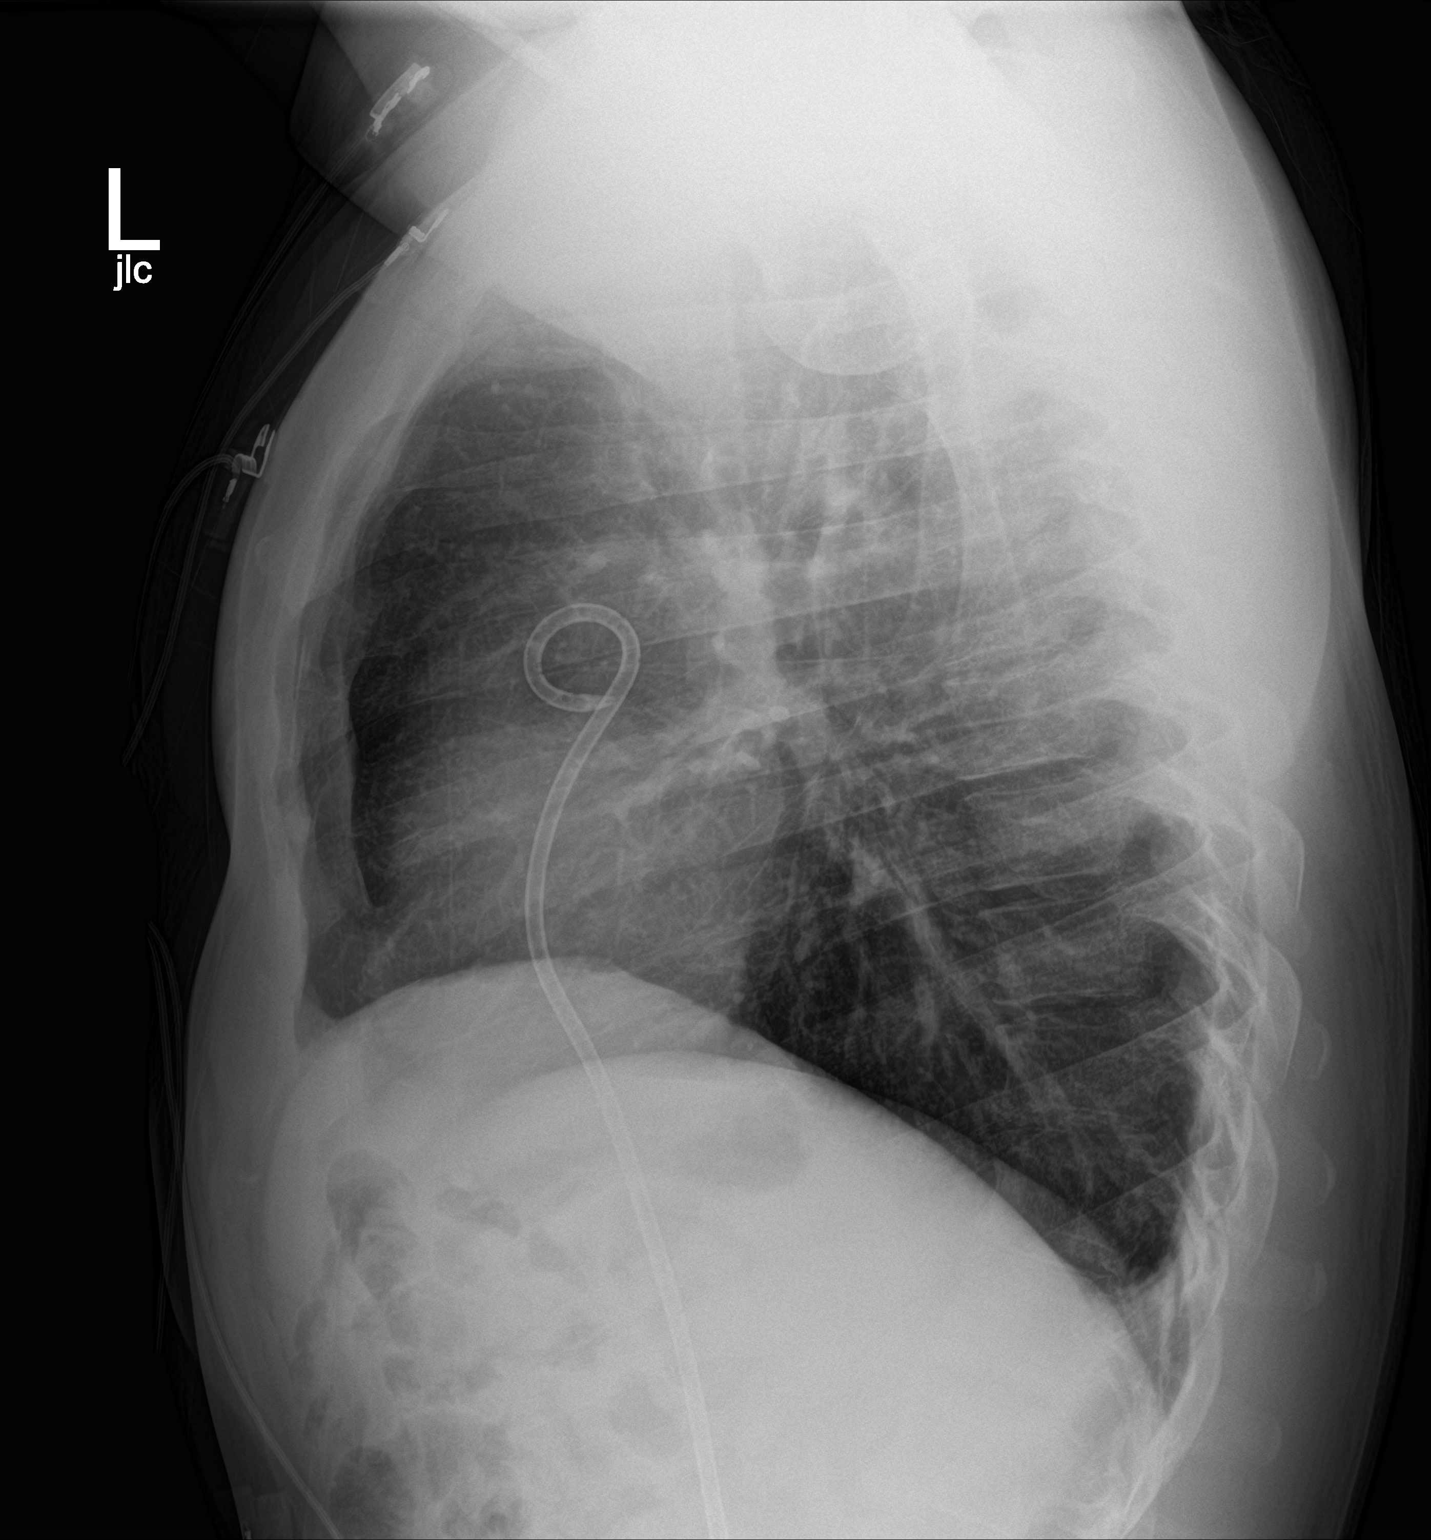

[2 of 2 positions shown; findings below may reference images not displayed]

FINDINGS: Trachea is midline. Heart size normal. Near complete re-expansion of
the left lung with trace left apical pleural air and small bore left
chest tube in place. Lungs are clear. No pleural fluid. Subcutaneous
emphysema along the left lateral chest wall.
IMPRESSION: Trace residual left apical pneumothorax, decreased from 04/19/2018,
with left chest tube in place.

## 2021-12-19 ENCOUNTER — Telehealth: Payer: Self-pay | Admitting: Hematology and Oncology

## 2021-12-19 NOTE — Telephone Encounter (Signed)
Scheduled appt per 5/9 referral. Pt is aware of appt date and time. Pt is aware to arrive 15 mins prior to appt time and to bring and updated insurance card. Pt is aware of appt location.   ?

## 2021-12-27 ENCOUNTER — Other Ambulatory Visit: Payer: Self-pay | Admitting: *Deleted

## 2021-12-27 DIAGNOSIS — N63 Unspecified lump in unspecified breast: Secondary | ICD-10-CM

## 2022-01-03 ENCOUNTER — Inpatient Hospital Stay: Payer: Self-pay

## 2022-01-03 ENCOUNTER — Inpatient Hospital Stay: Payer: Self-pay | Attending: Hematology and Oncology | Admitting: Hematology and Oncology

## 2022-04-24 ENCOUNTER — Telehealth: Payer: Self-pay | Admitting: Hematology and Oncology

## 2022-04-24 NOTE — Telephone Encounter (Signed)
Scheduled appt per 9/12 referral. Pt is aware of appt date and time. Pt is aware to arrive 15 mins prior to appt time and to bring and updated insurance card. Pt is aware of appt location.   

## 2022-05-17 ENCOUNTER — Inpatient Hospital Stay: Payer: Self-pay | Attending: Hematology and Oncology | Admitting: Hematology and Oncology

## 2022-05-17 ENCOUNTER — Inpatient Hospital Stay: Payer: Self-pay
